# Patient Record
Sex: Female | Born: 1960 | Race: White | Hispanic: No | Marital: Married | Smoking: Never smoker
Health system: Southern US, Community
[De-identification: ages and names within clinical notes are randomized; demographics above are authoritative.]

## PROBLEM LIST (undated history)

## (undated) DIAGNOSIS — R609 Edema, unspecified: Secondary | ICD-10-CM

## (undated) HISTORY — DX: Edema, unspecified: R60.9

---

## 2006-02-02 HISTORY — PX: VARICOSE VEIN SURGERY: SHX832

## 2014-02-06 ENCOUNTER — Emergency Department (HOSPITAL_COMMUNITY): Payer: BLUE CROSS/BLUE SHIELD

## 2014-02-06 ENCOUNTER — Emergency Department (HOSPITAL_COMMUNITY)
Admission: EM | Admit: 2014-02-06 | Discharge: 2014-02-06 | Disposition: A | Discharge status: Home / Self Care | Payer: BLUE CROSS/BLUE SHIELD | Attending: Emergency Medicine | Admitting: Emergency Medicine

## 2014-02-06 ENCOUNTER — Encounter (HOSPITAL_COMMUNITY): Payer: Self-pay | Admitting: Emergency Medicine

## 2014-02-06 DIAGNOSIS — S99912A Unspecified injury of left ankle, initial encounter: Secondary | ICD-10-CM | POA: Diagnosis present

## 2014-02-06 DIAGNOSIS — W108XXA Fall (on) (from) other stairs and steps, initial encounter: Secondary | ICD-10-CM | POA: Insufficient documentation

## 2014-02-06 DIAGNOSIS — Z7982 Long term (current) use of aspirin: Secondary | ICD-10-CM | POA: Insufficient documentation

## 2014-02-06 DIAGNOSIS — Z79899 Other long term (current) drug therapy: Secondary | ICD-10-CM | POA: Diagnosis not present

## 2014-02-06 DIAGNOSIS — Y9289 Other specified places as the place of occurrence of the external cause: Secondary | ICD-10-CM | POA: Diagnosis not present

## 2014-02-06 DIAGNOSIS — M25579 Pain in unspecified ankle and joints of unspecified foot: Secondary | ICD-10-CM

## 2014-02-06 DIAGNOSIS — S93402A Sprain of unspecified ligament of left ankle, initial encounter: Secondary | ICD-10-CM | POA: Diagnosis not present

## 2014-02-06 DIAGNOSIS — Y9389 Activity, other specified: Secondary | ICD-10-CM | POA: Insufficient documentation

## 2014-02-06 DIAGNOSIS — Y998 Other external cause status: Secondary | ICD-10-CM | POA: Diagnosis not present

## 2014-02-06 MED ORDER — IBUPROFEN 800 MG PO TABS: 800.0000 mg | ORAL_TABLET | Freq: Three times a day (TID) | ORAL | Status: AC

## 2014-02-06 NOTE — ED Provider Notes (Signed)
CSN: [id number]     Arrival date & time 02/06/14  2130 History   First MD Initiated Contact with Patient 02/06/14 [phone]     Chief Complaint  Patient presents with  . Ankle Pain   HPI  Patient is a 54 year old female with no past medical history presents emergency room for evaluation of left ankle pain. Patient states that she was walking downstairs this morning at 5:30 when she missed a step and inverted her ankle. She does not fall, she did not hit her head, she did not lose consciousness. Patient states that she was able to get herself up and walk back into the house but was experiencing pain when walking. She laid down on the couch before deciding to come here. She is not taking any medications today. She states that she has minimal to no pain when laying there. She feels that she has mild pain when she everts or inverts her ankle or bears weight. Patient has never injured this ankle before. 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 No past medical history on file. Past Surgical History  Procedure Laterality Date  . Varicose vein surgery Left 2008   No family history on file. History  Substance Use Topics  . Smoking status: Never Smoker   . Smokeless tobacco: Not on file  . Alcohol Use: No   OB History    No data available     Review of Systems  Constitutional: Negative for fever, chills and fatigue.  Respiratory: Negative for chest tightness and shortness of breath.   Cardiovascular: Negative for chest pain and palpitations.  Gastrointestinal: Negative for nausea and vomiting.  Musculoskeletal: Positive for joint swelling, arthralgias and gait problem.  Skin: Positive for color change.  Neurological: Negative for numbness.  All other systems reviewed and are negative.     Allergies  Flexeril  Home Medications   Prior to Admission medications   Medication Sig Start Date End Date Taking? Authorizing Provider  aspirin-sod bicarb-citric acid  (ALKA-SELTZER) 325 MG TBEF tablet Take 325 mg by mouth every 6 (six) hours as needed.   Yes Historical Provider, MD  Homeopathic Products [facility] COLD REMEDY PO) Take 1 Dose by mouth as needed (cold symptoms).   Yes Historical Provider, MD  vitamin C (ASCORBIC ACID) 250 MG tablet Take 250 mg by mouth daily.   Yes Historical Provider, MD  ibuprofen (ADVIL,MOTRIN) 800 MG tablet Take 1 tablet (800 mg total) by mouth 3 (three) times daily. 02/06/14   Ylonda Storr A Forcucci, PA-C   BP 136/84 mmHg  Pulse 66  Temp(Src) 97.8 F (36.6 C) (Oral)  Resp 15  SpO2 100% Physical Exam  Constitutional: She is oriented to person, place, and time. She appears well-developed and well-nourished. No distress.  HENT:  Head: Normocephalic and atraumatic.  Mouth/Throat: Oropharynx is clear and moist. No oropharyngeal exudate.  Eyes: Conjunctivae and EOM are normal. Pupils are equal, round, and reactive to light. No scleral icterus.  Neck: Normal range of motion. Neck supple. No JVD present. No thyromegaly present.  Cardiovascular: Normal rate, regular rhythm, normal heart sounds and intact distal pulses.  Exam reveals no gallop and no friction rub.   No murmur heard. Pulses:      Dorsalis pedis pulses are 2+ on the right side, and 2+ on the left side.       Posterior tibial pulses are 2+ on the right side, and 2+ on the left side.  Pulmonary/Chest: Effort normal and  breath sounds normal. No respiratory distress. She has no wheezes. She has no rales. She exhibits no tenderness.  Musculoskeletal:       Left ankle: She exhibits swelling and ecchymosis. She exhibits normal range of motion, no deformity, no laceration and normal pulse. Tenderness. CF ligament and posterior TFL tenderness found. No lateral malleolus, no medial malleolus, no AITFL, no head of 5th metatarsal and no proximal fibula tenderness found. Achilles tendon normal.  Lymphadenopathy:    She has no cervical adenopathy.  Neurological: She is alert and  oriented to person, place, and time.  Skin: She is not diaphoretic.  Nursing note and vitals reviewed.   ED Course  Procedures (including critical care time) Labs Review Labs Reviewed - No data to display  Imaging Review Dg Ankle Complete Left  02/06/2014   CLINICAL DATA:  54 year old female with twisting injury resulting in acute pain and swelling at the lateral ankle. Initial encounter.  EXAM: LEFT ANKLE COMPLETE - 3+ VIEW  COMPARISON:  None.  FINDINGS: Soft tissue swelling at the lateral malleolus. Positive joint effusion and anterior soft tissue swelling evident on the lateral view. Mortise joint alignment is maintained. Talar dome appears intact. Calcaneus intact. Lateral malleolus intact. No acute fracture or dislocation identified.  IMPRESSION: Joint effusion and soft tissue swelling at the left ankle with no acute fracture or dislocation identified.   Electronically Signed   By: Augusto GambleLee  Hall M.D.   On: 02/06/2014 08:08   Dg Foot Complete Left  02/06/2014   CLINICAL DATA:  54 year old female with twisting injury resulting in acute pain and swelling at the lateral ankle. Initial encounter.  EXAM: LEFT FOOT - COMPLETE 3+ VIEW  COMPARISON:  Left ankle series from the same day reported separately.  FINDINGS: See left ankle findings reported separately.  Bone mineralization is within normal limits. Calcaneus intact. Joint spaces and alignment in the left foot are within normal limits.  Suggestion of mild irregularity about the lateral aspect of the cuboid on the lateral view instead appears related to a small accessory ossicle. There is some increased soft tissue density and swelling in that region.  Elsewhere no acute fracture or dislocation.  IMPRESSION: 1. Questionable avulsion fracture from the lateral cuboid, but favor instead an accessory ossicle with soft tissue injury in the region. 2. Otherwise no acute fracture or dislocation identified about the left foot. 3. See left ankle series reported  separately.   Electronically Signed   By: Augusto GambleLee  Hall M.D.   On: 02/06/2014 08:28     EKG Interpretation None      MDM   Final diagnoses:  Ankle sprain, left, initial encounter   Patient is a 54 year old female who presents emergency room for evaluation of left ankle pain after injury. Physical exam reveals neurovascularly intact left ankle and foot. There is no bony tenderness to palpation. There is water at swelling and ecchymosis. Plain film x-ray reveals no acute fractures. Given no bony tenderness suspect that there is likely an accessory bone in the foot. Doubt avulsion fracture. We'll place patient in an ASO and will give crutches. Patient follow-up with her PCP as needed. We'll give work note. We'll send home with ibuprofen and Tylenol as needed for pain. I have encouraged rice therapy. Patient to return for compartment syndrome symptoms. She states understanding and agreement at this time. Patient is stable for discharge.    Eben Burowourtney A Forcucci, PA-C 02/06/14 [id number]  Raeford RazorStephen Kohut, MD 02/06/14 [phone]

## 2014-02-06 NOTE — ED Notes (Signed)
Patient taken to the waiting room by this RN.  Patient showed and explained how to use the crutches.  Patient alert and oriented at discharge.

## 2014-02-06 NOTE — ED Notes (Signed)
PT reports was on the way to work and missed at step. Left ankle visibly swollen and bruised. No head trauma or LOC.

## 2014-02-06 NOTE — Discharge Instructions (Signed)
Ankle Sprain °An ankle sprain is an injury to the strong, fibrous tissues (ligaments) that hold the bones of your ankle joint together.  °CAUSES °An ankle sprain is usually caused by a fall or by twisting your ankle. Ankle sprains most commonly occur when you step on the outer edge of your foot, and your ankle turns inward. People who participate in sports are more prone to these types of injuries.  °SYMPTOMS  °· Pain in your ankle. The pain may be present at rest or only when you are trying to stand or walk. °· Swelling. °· Bruising. Bruising may develop immediately or within 1 to 2 days after your injury. °· Difficulty standing or walking, particularly when turning corners or changing directions. °DIAGNOSIS  °Your caregiver will ask you details about your injury and perform a physical exam of your ankle to determine if you have an ankle sprain. During the physical exam, your caregiver will press on and apply pressure to specific areas of your foot and ankle. Your caregiver will try to move your ankle in certain ways. An X-ray exam may be done to be sure a bone was not broken or a ligament did not separate from one of the bones in your ankle (avulsion fracture).  °TREATMENT  °Certain types of braces can help stabilize your ankle. Your caregiver can make a recommendation for this. Your caregiver may recommend the use of medicine for pain. If your sprain is severe, your caregiver may refer you to a surgeon who helps to restore function to parts of your skeletal system (orthopedist) or a physical therapist. °HOME CARE INSTRUCTIONS  °· Apply ice to your injury for 1-2 days or as directed by your caregiver. Applying ice helps to reduce inflammation and pain. °¨ Put ice in a plastic bag. °¨ Place a towel between your skin and the bag. °¨ Leave the ice on for 15-20 minutes at a time, every 2 hours while you are awake. °· Only take over-the-counter or prescription medicines for pain, discomfort, or fever as directed by  your caregiver. °· Elevate your injured ankle above the level of your heart as much as possible for 2-3 days. °· If your caregiver recommends crutches, use them as instructed. Gradually put weight on the affected ankle. Continue to use crutches or a cane until you can walk without feeling pain in your ankle. °· If you have a plaster splint, wear the splint as directed by your caregiver. Do not rest it on anything harder than a pillow for the first 24 hours. Do not put weight on it. Do not get it wet. You may take it off to take a shower or bath. °· You may have been given an elastic bandage to wear around your ankle to provide support. If the elastic bandage is too tight (you have numbness or tingling in your foot or your foot becomes cold and blue), adjust the bandage to make it comfortable. °· If you have an air splint, you may blow more air into it or let air out to make it more comfortable. You may take your splint off at night and before taking a shower or bath. Wiggle your toes in the splint several times per day to decrease swelling. °SEEK MEDICAL CARE IF:  °· You have rapidly increasing bruising or swelling. °· Your toes feel extremely cold or you lose feeling in your foot. °· Your pain is not relieved with medicine. °SEEK IMMEDIATE MEDICAL CARE IF: °· Your toes are numb or blue. °·   You have severe pain that is increasing. °MAKE SURE YOU:  °· Understand these instructions. °· Will watch your condition. °· Will get help right away if you are not doing well or get worse. °Document Released: 01/19/2005 Document Revised: 10/14/2011 Document Reviewed: 01/31/2011 °ExitCare® Patient Information ©2015 [employer]. This information is not intended to replace advice given to you by your health care provider. Make sure you discuss any questions you have with your health care provider. ° ° °Emergency Department Resource Guide °1) Find a Doctor and Pay Out of Pocket °Although you won't have to find out who is covered  by your insurance plan, it is a good idea to ask around and get recommendations. You will then need to call the office and see if the doctor you have chosen will accept you as a new patient and what types of options they offer for patients who are self-pay. Some doctors offer discounts or will set up payment plans for their patients who do not have insurance, but you will need to ask so you aren't surprised when you get to your appointment. ° °2) Contact Your Local Health Department °Not all health departments have doctors that can see patients for sick visits, but many do, so it is worth a call to see if yours does. If you don't know where your local health department is, you can check in your phone book. The CDC also has a tool to help you locate your state's health department, and many state websites also have listings of all of their local health departments. ° °3) Find a Walk-in Clinic °If your illness is not likely to be very severe or complicated, you may want to try a walk in clinic. These are popping up all over the country in pharmacies, drugstores, and shopping centers. They're usually staffed by nurse practitioners or physician assistants that have been trained to treat common illnesses and complaints. They're usually fairly quick and inexpensive. However, if you have serious medical issues or chronic medical problems, these are probably not your best option. ° °No Primary Care Doctor: °- Call [facility] at  832-8000 - they can help you locate a primary care doctor that  accepts your insurance, provides certain services, etc. °- Physician Referral Service- 1-800-533-3463 ° °Chronic Pain Problems: °Organization         Address  Phone   Notes  ° [facility]  (336) 297-2271 Patients need to be referred by their primary care doctor.  ° °Medication Assistance: °Organization         Address  Phone   Notes  °[place] [facility] [address] °[place], [place] [address] (336) 641-8030 --Must be a resident of [place] °-- Must have NO insurance coverage whatsoever (no Medicaid/ Medicare, etc.) °-- The pt. MUST have a primary care doctor that directs their care regularly and follows them in the community °  °[facility]  (866) 331-1348   °[employer] Way  (888) 892-1162   ° °Agencies that provide inexpensive medical care: °Organization         Address  Phone   Notes  °[facility]  (336) 832-8035   °[facility]    (336) 832-7272   °[facility] [address] °[place], [place] [address] (336) 832-4777   °[facility] [address], °[place] (336) 271-4999   °[facility]    (336) 373-0678   °[facility]    (  336) 272-1050   °[facility] ° [address], [place] Phone:  (336) 832-4444, Fax:  (336) 832-4440 Hours of Operation:  9 am - 6 pm, M-F.  Also accepts Medicaid/Medicare and self-pay.  °[facility] ° [address], [place] Phone: (336) 832-3150, Fax: (336) 832-3151. Hours of Operation:  8:30 am - 5:30 pm, M-F.  Also accepts Medicaid and self-pay.  °[facility] [place] [address], [place] Phone: (336) 878-6027   °[facility] [address], [place], [place] (336)723-1848, Ext. 123 Mondays & Thursdays: 7-9 AM.  First 15 patients are seen on a first come, first serve basis. °  ° °Medicaid-accepting [place] Providers: ° °Organization         Address  Phone   Notes  °[facility] [address], [place] (336) 641-2100 Also accepts self-pay patients.  °[facility] [address], [place] ° (336) 856-9996   °[facility] [address], [place] (336) 288-8857   °[facility] [address] [place] [address], [place] (336) 299-7000   °Veita Bland [address], [place]  ° (336) 373-1557 Only accepts [place] Access Medicaid patients after they have their name applied to their card.  ° °Self-Pay (no insurance) in [place]: ° °Organization         Address  Phone   Notes  °Sickle Cell Patients, [facility] [address], [place] (336) 832-1970   °[facility] [address], [place] (336) 832-4400   ° [facility] ° [address], [place] (336) 992-4800   °[facility]/Dr. Osei-Bonsu ° [address] [place] [address], [place] or [address], [place] (336) 841-8500 Phone number for both [place] and [place] locations is the same.  °[facility] [address], [place] (336) 299-0000   °[facility] [address] [place] [address], [place] or [address] (336) 852-7530 °(336) 878-2260   °[facility] [address], [place] (336) 350-1642, phone; (336) 294-5005, fax Sees patients 1st and 3rd Saturday of every month.  Must not qualify for public or private insurance (i.e. Medicaid, Medicare, [place] Health Choice, Veterans' Benefits) • Household income should be no more than 200% of the poverty level •The clinic cannot treat you if you are pregnant or think you are pregnant • Sexually transmitted diseases are not treated at the clinic.  ° ° °Dental Care: °Organization         Address  Phone  Notes  °[facility] [address], [place] (336) 641-6152 Accepts children up to age 21 who are enrolled in Medicaid or [place] Health Choice; pregnant women with a Medicaid card; and children who have applied for Medicaid or [place] Health Choice, but were declined, whose parents can pay a reduced fee at time of service.  °[facility] [place]  [address], [place] (336) 641-7733 Accepts children up to age 21 who are enrolled  in Medicaid or [place] Health Choice; pregnant women with a Medicaid card; and children who have applied for Medicaid or [place] Health Choice, but were declined, whose parents can pay a reduced fee at time of service.  °[facility] ° [address], [place] (336) 641-4533 Patients are seen by appointment only. Walk-ins are not accepted. [facility] will see patients 18 years of age and older. °Monday - Tuesday (8am-5pm) °Most Wednesdays (8:30-5pm) °$30 per visit, cash only  °[facility] ° [address], [place] (336) 641-4533 Patients are seen by appointment only. Walk-ins are not accepted. [facility] will see patients 18 years of age and older. °One Wednesday Evening (Monthly: Volunteer Based).  $30 per visit, cash only  °[facility]  (919) 537-3737 for adults; Children under age 4, call Graduate Pediatric Dentistry at (919) [phone]. Children aged 4-14, please call (919) [phone] to request a pediatric application. ° Dental services are provided in all areas of dental care including fillings, crowns and bridges, complete and partial dentures, implants, gum treatment, root canals, and extractions. Preventive care is also provided. Treatment is provided to both adults and children. °Patients are selected via a lottery and there is often a waiting list. °  °[facility] [address], °[place] ° (336) 763-8833 [email or web address] °  °[facility] [address], [place], [place] (336)723-1848, Ext. 123 Second and Fourth Thursday of each month, opens at 6:30 AM; Clinic ends at 9 AM.  Patients are seen on a first-come first-served basis, and a limited number are seen during each clinic.  ° °[facility] ° [address], [place], [place] (336) 723-7904   Eligibility Requirements °You must have lived in [place], or [place] counties for at least the last three months. °  You cannot be  eligible for state or federal sponsored healthcare insurance, including Veterans Administration, Medicaid, or Medicare. °  You generally cannot be eligible for healthcare insurance through your employer.  °  How to apply: °Eligibility screenings are held every Tuesday and Wednesday afternoon from 1:00 pm until 4:00 pm. You do not need an appointment for the interview!  °[facility] [address], [place], [place] 336-631-2330   °[facility] [facility]  336-342-8273   °[place]  336-703-3100   ° [facility]  336-570-6415   ° °Behavioral Health Resources in the Community: °Intensive Outpatient Programs °Organization         Address  Phone  Notes  °[facility] [address], [place], [place] 336-878-6098   °[facility] [address], [place], [place] 336-832-9800   °[facility] [address], [place], [place] ° 336-882-2125   °[facility] [address],  °[place], [place] 1-800-853-5163 or 336-641-4981   °Substance Abuse Resources °Organization         Address  Phone  Notes  °[facility]  336-882-2125   °[facility]  336-784-9470   °[facility]  336-285-9073   °[facility]  336-845-3988   °Residential & Outpatient Substance Abuse Program  1-800-659-3381   °Psychological Services °Organization         Address  Phone  Notes  °[facility]  336- 832-9600   °[facility]  336- 378-7881   °[facility] [address], [place] 1-800-853-5163 or 336-641-4981   ° °Mobile Crisis Teams °Organization         Address  Phone  Notes  °[facility], Mobile Crisis Care Unit  1-877-626-1772   °Assertive °[facility] ° [address] ,    336-834-9664   °Sharon DeEsch [address] °[place] 336-554-5454   ° °Self-Help/Support Groups °Organization          Address  Phone             Notes  °[facility] - variety of support groups  336- 373-1402 Call for more information  °Narcotics Anonymous (NA), [facility] [address], °[place]  2 meetings at this location  ° °Residential Treatment Programs °Organization         Address  Phone  Notes  °[facility] [address],    °[place]  1-866-801-8205   °[facility] ° [address], [place], [place] 704-293-8524   °[facility] [address], [place] 336-845-3988 Admissions: 8am-3pm M-F  °[facility] [address]    °[place], [place] 336-841-1104   °[facility] [address], [place], [place] 336-379-7146   °[facility] [address]  °[place], [place] 336-285-9073   °[facility] - Intensive Outpatient [address], [place], [place] 336-852-3033   °[facility] [address]  °[place], [place] 1-877-615-2722 or 336-784-9470   °[facility] [address] [place], [place] 336-227-7417 Accepts Medicaid  °[facility] [address]  ° [place] 1-800-659-3381 Substance Abuse/Addiction Treatment  ° °[place] Behavioral Health Resources °Organization         Address  Phone  Notes  °[facility]  (888) 581-9988   °Julie Brannon, PhD [address] [place], [place]   (336) 349-5553 or (336) 951-0000   ° Behavioral   [address] °[place], [place] (336) 349-4454   °[facility] [address], [place], [place] (336) 342-8316 Insurance/Medicaid/sponsorship through [facility]  °[facility] [address]                                    [place], [place] (336) 342-8316 Therapy/tele-psych/case  °[facility] [address]  ° [place], [place] (336) 349-2233    °Dr. Arfeen  (336) 349-4544   °[facility] [place]  [employer]  [place] [facility] 1) [address], [place] °2) [address], [place] °3)  [address], [place] (336) 349-3220 °(336) 342-7768 ° °(336) 342-8140   °[place] [facility] Abuse [facility] (336) 342-1394 or (336) 342-3537 (After Hours)    ° ° ° °

## 2014-02-23 ENCOUNTER — Ambulatory Visit: Payer: Self-pay

## 2014-03-09 ENCOUNTER — Ambulatory Visit (INDEPENDENT_AMBULATORY_CARE_PROVIDER_SITE_OTHER): Payer: BLUE CROSS/BLUE SHIELD

## 2014-03-09 VITALS — BP 135/78 | HR 77 | Resp 18

## 2014-03-09 DIAGNOSIS — R609 Edema, unspecified: Secondary | ICD-10-CM

## 2014-03-09 DIAGNOSIS — S93402S Sprain of unspecified ligament of left ankle, sequela: Secondary | ICD-10-CM

## 2014-03-09 NOTE — Progress Notes (Signed)
   Subjective:    Patient ID: Erin Jensen, female    DOB: 07/12/1960, 54 y.o.   MRN: [id number]  HPI ABOUT A MONTH AGO I FELL ON THE OUTSIDE STEP AND FELL ON SOME ICE AND WENT TO URGENT CARE AND THEY DID X-RAYS AND GAVE ME A BOOT AND THEN WENT TO [facility]SE [place] ON MY LEFT ANKLE    Review of Systems  All other systems reviewed and are negative.      Objective:   Physical Exam Lower extremity objective findings reveal vascular status to be intact there is +1 edema on the left ankle supposed the right this is consistent with immobilization there is some muscle atrophy of the left leg due to immobilization for the past month the boot. Some guarded motion dorsal flexion and plantar flexion inversion eversion of the left ankle however no refusal pain or symptomology there patient did have some history of ecchymosis the medial lateral malleoli are area however currently there is still some slight tenderness the medial malleoli or no tenderness or pain on lateral malleolus or along the peroneal tendons. Function and stability noted may discontinue boot and return to come for walking shoes or athletic shoes maintain compression stockings to help with the edema as she starts to use the muscles the edema should also reduce over time.       Assessment & Plan:  Assessment based on clinical evaluation is resolving are healing a moderate ankle sprain including lateral medial malleoli are area again still some soft tissue ankle swelling and edema with ankle fusion being noted on previous x-rays patient will use elevation ice and compression stocking return to activities with exercise consider stretching exercises or therapy band type exercises Using a bungee cord rubber bands. Follow-up with in 2 months if not completely resolved for evaluation and he's had pain past 3 months after the injury MRI may be needed to assess soft tissue injury or damage next  Alvan Dameichard Charie Pinkus DPM

## 2014-03-09 NOTE — Patient Instructions (Signed)
ICE INSTRUCTIONS  Apply ice or cold pack to the affected area at least 3 times a day for 10-15 minutes each time.  You should also use ice after prolonged activity or vigorous exercise.  Do not apply ice longer than 20 minutes at one time.  Always keep a cloth between your skin and the ice pack to prevent burns.  Being consistent and following these instructions will help control your symptoms.  We suggest you purchase a gel ice pack because they are reusable and do bit leak.  Some of them are designed to wrap around the area.  Use the method that works best for you.  Here are some other suggestions for icing.   Use a frozen bag of peas or corn-inexpensive and molds well to your body, usually stays frozen for 10 to 20 minutes.  Wet a towel with cold water and squeeze out the excess until it's damp.  Place in a bag in the freezer for 20 minutes. Then remove and use.   Afterward ankle sprain the foot and ankle mid continue to swell for even up to 3 months. Bleeding and ice are the first line to treat swelling.  Also resume using compression stockings 20-30 mm compression  Continue with range of motion exercises moving the toes and feet up and down in circles, also consider using a bungee cord a rubber band type exercise equipment to strengthen the ankle and foot

## 2016-07-07 IMAGING — CR DG FOOT COMPLETE 3+V*L*
3 series · 3 of 3 positions shown · non-contrast
Comparison: Left ankle series from the same day reported
separately.

CLINICAL DATA: 53-year-old female with twisting injury resulting in
acute pain and swelling at the lateral ankle. Initial encounter.

EXAM:
LEFT FOOT - COMPLETE 3+ VIEW

[foot ap]
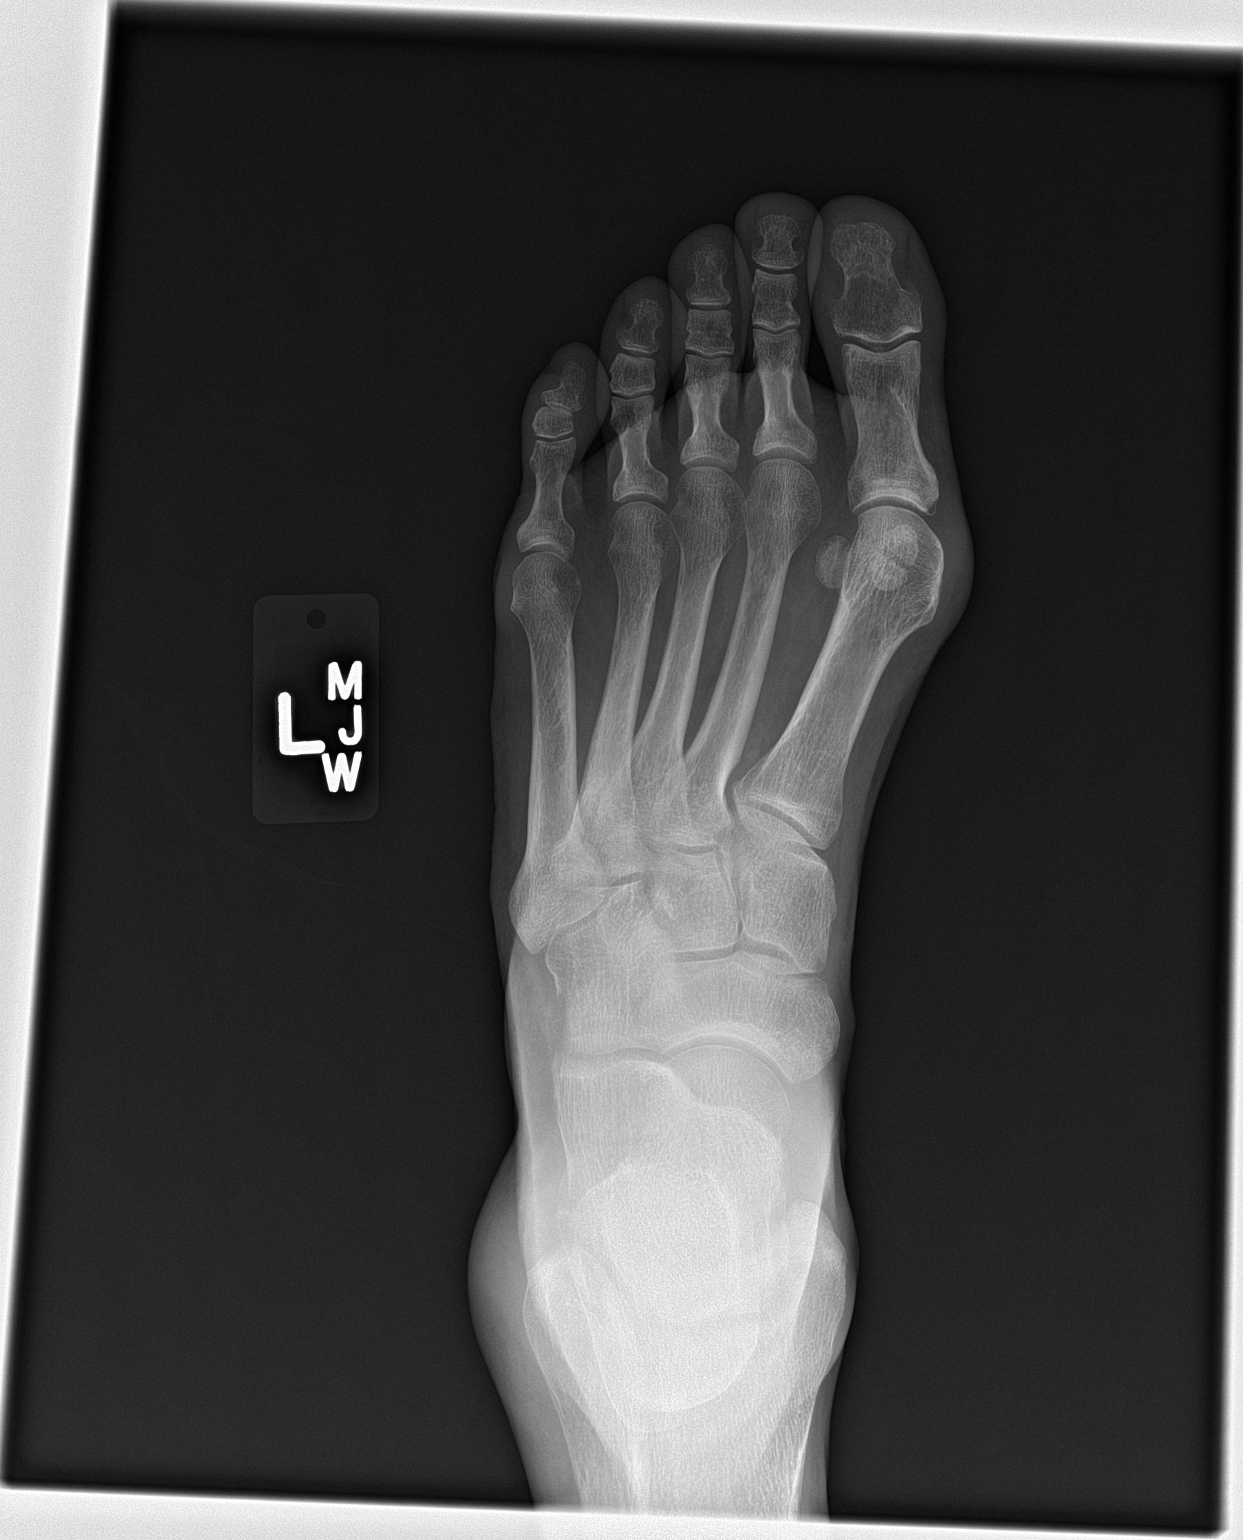

[foot obl]
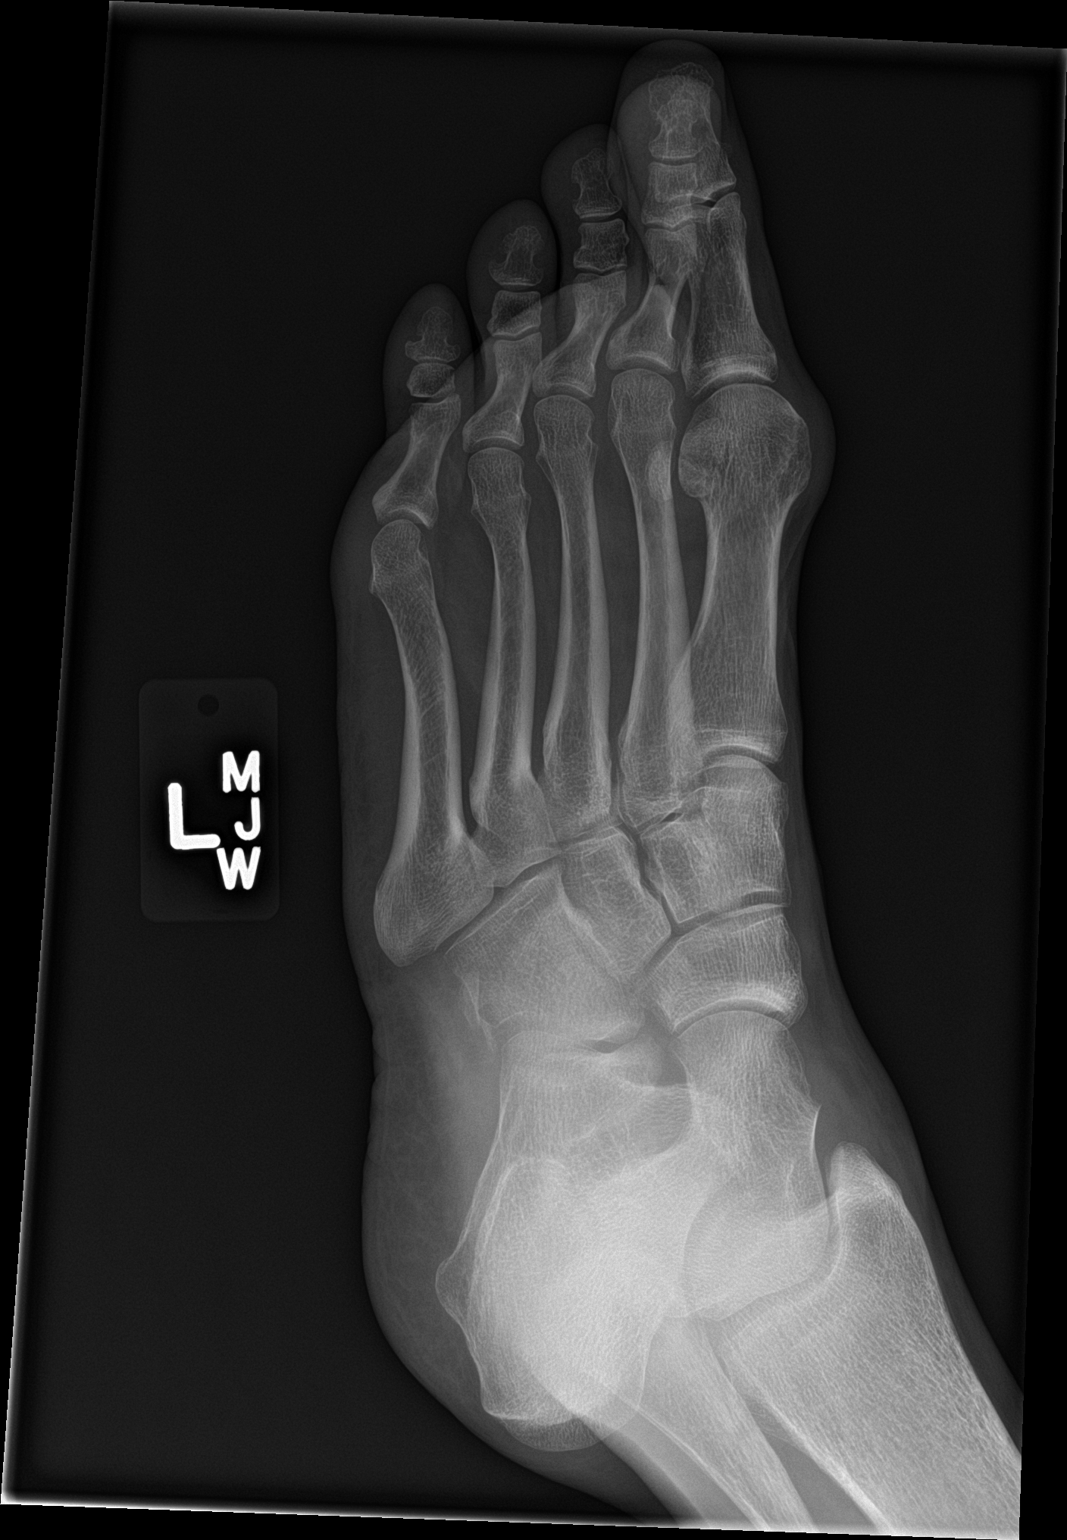

[foot lat]
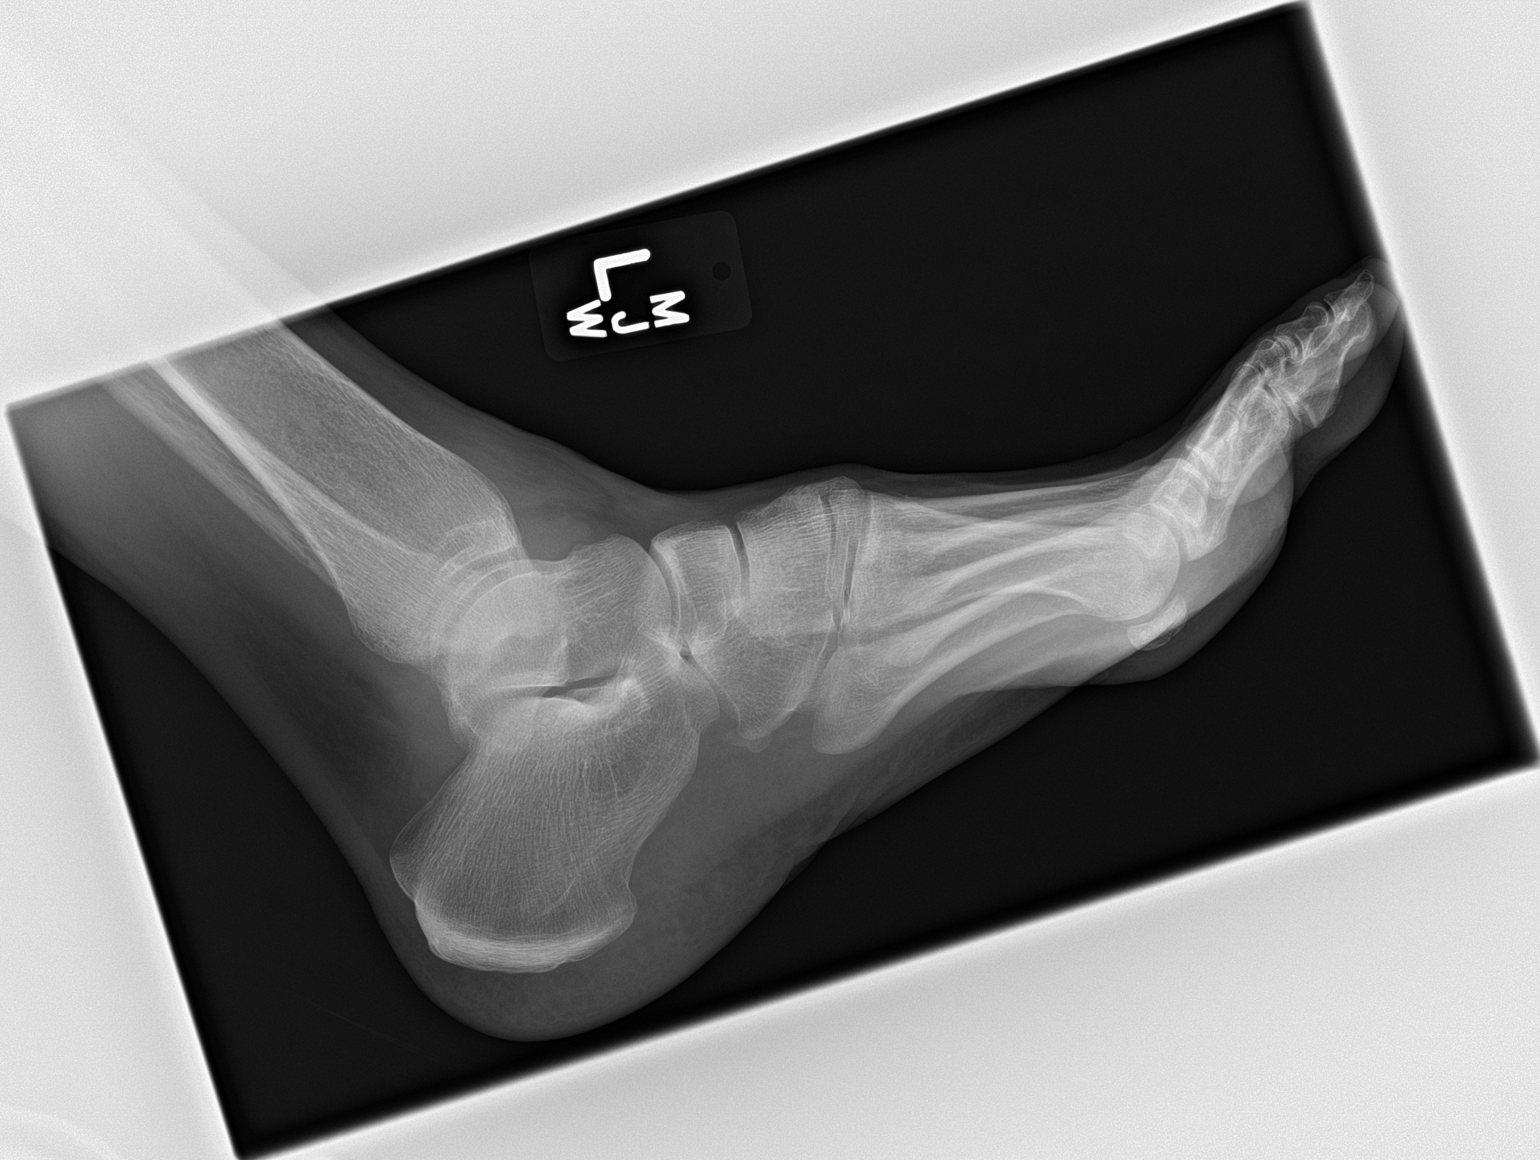

[3 of 3 positions shown; findings below may reference images not displayed]

FINDINGS: See left ankle findings reported separately.

Bone mineralization is within normal limits. Calcaneus intact. Joint
spaces and alignment in the left foot are within normal limits.

Suggestion of mild irregularity about the lateral aspect of the
cuboid on the lateral view instead appears related to a small
accessory ossicle. There is some increased soft tissue density and
swelling in that region.

Elsewhere no acute fracture or dislocation.
IMPRESSION: 1. Questionable avulsion fracture from the lateral cuboid, but favor
instead an accessory ossicle with soft tissue injury in the region.
2. Otherwise no acute fracture or dislocation identified about the
left foot.
3. See left ankle series reported separately.
